# Patient Record
Sex: Female | Born: 2005 | Race: Black or African American | Hispanic: No | Marital: Single | Smoking: Never smoker
Health system: Southern US, Community
[De-identification: ages and names within clinical notes are randomized; demographics above are authoritative.]

## PROBLEM LIST (undated history)

## (undated) HISTORY — PX: TONSILLECTOMY: SUR1361

---
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
## 2024-05-09 DIAGNOSIS — Z309 Encounter for contraceptive management, unspecified: Secondary | ICD-10-CM | POA: Diagnosis not present

## 2024-05-09 DIAGNOSIS — Z01419 Encounter for gynecological examination (general) (routine) without abnormal findings: Secondary | ICD-10-CM | POA: Diagnosis not present

## 2024-05-09 DIAGNOSIS — Z6826 Body mass index (BMI) 26.0-26.9, adult: Secondary | ICD-10-CM | POA: Diagnosis not present
